# Patient Record
Sex: Male | Born: 1980 | Race: Black or African American | Hispanic: No | Marital: Single | Smoking: Current every day smoker
Health system: Southern US, Community
[De-identification: ages and names within clinical notes are randomized; demographics above are authoritative.]

## PROBLEM LIST (undated history)

## (undated) DIAGNOSIS — E119 Type 2 diabetes mellitus without complications: Secondary | ICD-10-CM

## (undated) DIAGNOSIS — E7800 Pure hypercholesterolemia, unspecified: Secondary | ICD-10-CM

---

## 2015-02-11 ENCOUNTER — Emergency Department (HOSPITAL_BASED_OUTPATIENT_CLINIC_OR_DEPARTMENT_OTHER)
Admission: EM | Admit: 2015-02-11 | Discharge: 2015-02-11 | Disposition: A | Discharge status: Home / Self Care | Payer: Self-pay | Attending: Emergency Medicine | Admitting: Emergency Medicine

## 2015-02-11 ENCOUNTER — Emergency Department (HOSPITAL_BASED_OUTPATIENT_CLINIC_OR_DEPARTMENT_OTHER): Payer: Self-pay

## 2015-02-11 ENCOUNTER — Encounter (HOSPITAL_BASED_OUTPATIENT_CLINIC_OR_DEPARTMENT_OTHER): Payer: Self-pay | Admitting: Emergency Medicine

## 2015-02-11 DIAGNOSIS — Z79899 Other long term (current) drug therapy: Secondary | ICD-10-CM | POA: Insufficient documentation

## 2015-02-11 DIAGNOSIS — Z794 Long term (current) use of insulin: Secondary | ICD-10-CM | POA: Insufficient documentation

## 2015-02-11 DIAGNOSIS — R079 Chest pain, unspecified: Secondary | ICD-10-CM | POA: Insufficient documentation

## 2015-02-11 DIAGNOSIS — Z720 Tobacco use: Secondary | ICD-10-CM | POA: Insufficient documentation

## 2015-02-11 DIAGNOSIS — E780 Pure hypercholesterolemia: Secondary | ICD-10-CM | POA: Insufficient documentation

## 2015-02-11 DIAGNOSIS — E119 Type 2 diabetes mellitus without complications: Secondary | ICD-10-CM | POA: Insufficient documentation

## 2015-02-11 HISTORY — DX: Type 2 diabetes mellitus without complications: E11.9

## 2015-02-11 HISTORY — DX: Pure hypercholesterolemia, unspecified: E78.00

## 2015-02-11 LAB — CBC WITH DIFFERENTIAL/PLATELET
Basophils Absolute: 0.1 10*3/uL (ref 0.0–0.1)
Basophils Relative: 1 % (ref 0–1)
Eosinophils Absolute: 0.6 10*3/uL (ref 0.0–0.7)
Eosinophils Relative: 6 % — ABNORMAL HIGH (ref 0–5)
HEMATOCRIT: 43 % (ref 39.0–52.0)
Hemoglobin: 14.5 g/dL (ref 13.0–17.0)
LYMPHS ABS: 3 10*3/uL (ref 0.7–4.0)
LYMPHS PCT: 35 % (ref 12–46)
MCH: 28.9 pg (ref 26.0–34.0)
MCHC: 33.7 g/dL (ref 30.0–36.0)
MCV: 85.8 fL (ref 78.0–100.0)
MONOS PCT: 8 % (ref 3–12)
Monocytes Absolute: 0.7 10*3/uL (ref 0.1–1.0)
NEUTROS ABS: 4.3 10*3/uL (ref 1.7–7.7)
NEUTROS PCT: 50 % (ref 43–77)
PLATELETS: 236 10*3/uL (ref 150–400)
RBC: 5.01 MIL/uL (ref 4.22–5.81)
RDW: 12.7 % (ref 11.5–15.5)
WBC: 8.6 10*3/uL (ref 4.0–10.5)

## 2015-02-11 LAB — BASIC METABOLIC PANEL
Anion gap: 8 (ref 5–15)
BUN: 9 mg/dL (ref 6–20)
CALCIUM: 8.7 mg/dL — AB (ref 8.9–10.3)
CO2: 25 mmol/L (ref 22–32)
Chloride: 105 mmol/L (ref 101–111)
Creatinine, Ser: 0.78 mg/dL (ref 0.61–1.24)
GFR calc Af Amer: >60 mL/min (ref >60)
GFR calc non Af Amer: >60 mL/min (ref >60)
Glucose, Bld: 188 mg/dL — ABNORMAL HIGH (ref 65–99)
POTASSIUM: 4 mmol/L (ref 3.5–5.1)
Sodium: 138 mmol/L (ref 135–145)

## 2015-02-11 LAB — TROPONIN I: Troponin I: <0.03 ng/mL (ref <0.031)

## 2015-02-11 MED ORDER — GI COCKTAIL ~~LOC~~
30.0000 mL | Freq: Once | ORAL | Status: AC
  Administered 2015-02-11: 30 mL via ORAL
  Filled 2015-02-11: qty 30

## 2015-02-11 NOTE — Discharge Instructions (Signed)

## 2015-02-11 NOTE — ED Notes (Signed)
Patient reports that he was at home and started to have pain to his left chest yesterday  Reports that he was sweating last night. Today the pain increased in intensity and the diaphoresis was worse.

## 2015-02-11 NOTE — ED Provider Notes (Signed)
CSN: [id number]     Arrival date & time 02/11/15  1934 History  This chart was scribed for Benjiman Core, MD by Phillis Haggis, ED Scribe. This patient was seen in room MH06/MH06 and patient care was started at 7:48 PM.   Chief Complaint  Patient presents with  . Chest Pain   The history is provided by the patient. No language interpreter was used.  HPI Comments: Matthew Faulkner is a 34 y.o. Male with hx of DM and hypercholesteremia who presents to the Emergency Department complaining of left tight, gradually worsening left chest pain and diaphoresis onset one night ago. He states that he was at work today around 4 PM with no changes in physical activity when he began to increasingly sweat more over a period of 45 minutes, soaking a t-shirt. States that he works at a [employer]. Reports associated nausea. States that he was seen for similar pain 10 years ago due to stress and was told that he had an enlarged heart.  He denies fever, chills, vomiting, cough, or SOB. Denies hx of MI or family hx of early onset MI. Pt states that he smokes 1 ppd; denies using cocaine, street drugs, or drinking alcohol.  Past Medical History  Diagnosis Date  . Diabetes mellitus without complication   . Hypercholesteremia    History reviewed. No pertinent past surgical history. History reviewed. No pertinent family history. History  Substance Use Topics  . Smoking status: Current Every Day Smoker  . Smokeless tobacco: Not on file  . Alcohol Use: No    Review of Systems  Constitutional: Positive for diaphoresis.  Cardiovascular: Positive for chest pain.  All other systems reviewed and are negative.  Allergies  Review of patient's allergies indicates no known allergies.  Home Medications   Prior to Admission medications   Medication Sig Start Date End Date Taking? Authorizing Provider  insulin aspart protamine- aspart (NOVOLOG MIX 70/30) (70-30) 100 UNIT/ML injection Inject 20 Units into the skin  2 (two) times daily with a meal.   Yes Historical Provider, MD  simvastatin (ZOCOR) 20 MG tablet Take 20 mg by mouth daily.   Yes Historical Provider, MD   BP 130/81 mmHg  Pulse 98  Temp(Src) 98.2 F (36.8 C) (Oral)  Resp 18  Ht  (1.803 m)  Wt 284 lb (128.822 kg)  BMI 39.63 kg/m2  SpO2 100%  Physical Exam  Constitutional: He is oriented to person, place, and time. He appears well-developed and well-nourished.  HENT:  Head: Normocephalic and atraumatic.  Eyes: EOM are normal.  Neck: Normal range of motion. Neck supple.  Cardiovascular: Normal rate and regular rhythm.   Pulmonary/Chest: Effort normal and breath sounds normal.  Slight sternal tenderness  Musculoskeletal: Normal range of motion.  Neurological: He is alert and oriented to person, place, and time.  Skin: Skin is warm and dry.  Psychiatric: He has a normal mood and affect. His behavior is normal.  Nursing note and vitals reviewed.   ED Course  Procedures (including critical care time) DIAGNOSTIC STUDIES: Oxygen Saturation is 100% on RA, normal by my interpretation.    COORDINATION OF CARE: 7:52 PM-Discussed treatment plan which includes x-ray, labs, and EKG with pt at bedside and pt agreed to plan.   Labs Review Labs Reviewed  CBC WITH DIFFERENTIAL/PLATELET - Abnormal; Notable for the following:    Eosinophils Relative 6 (*)    All other components within normal limits  BASIC METABOLIC PANEL - Abnormal; Notable for the following:  Glucose, Bld 188 (*)    Calcium 8.7 (*)    All other components within normal limits  TROPONIN I    Imaging Review Dg Chest 2 View  02/11/2015   CLINICAL DATA:  34 year old male with chest pain  EXAM: CHEST  2 VIEW  COMPARISON:  None.  FINDINGS: The heart size and mediastinal contours are within normal limits. Both lungs are clear. The visualized skeletal structures are unremarkable.  IMPRESSION: No active cardiopulmonary disease.   Electronically Signed   By: Elgie CollardArash   Radparvar M.D.   On: 02/11/2015 20:15     EKG Interpretation   Date/Time:  Tuesday February 11 2015 19:43:40 EDT Ventricular Rate:  97 PR Interval:  150 QRS Duration: 82 QT Interval:  352 QTC Calculation: 447 R Axis:   65 Text Interpretation:  Normal sinus rhythm Normal ECG Confirmed by  Rubin PayorPICKERING  MD, Harrold DonathNATHAN [id number]) on 02/11/2015 7:46:30 PM      MDM   Final diagnoses:  Chest pain, unspecified chest pain type    Chest pain. EKG and lab work reassuring. X-ray reassuring. Somewhat worried some story but has apparently had this before. Will discharge home but will need to follow-up with primary care doctor. I personally performed the services described in this documentation, which was scribed in my presence. The recorded information has been reviewed and is accurate.     Benjiman CoreNathan Christon Gallaway, MD 02/12/15 2125

## 2016-10-16 IMAGING — CR DG CHEST 2V
2 series · 2 of 2 positions shown · non-contrast
Comparison: None.

CLINICAL DATA: 33-year-old male with chest pain

EXAM:
CHEST  2 VIEW

[w chest pa]
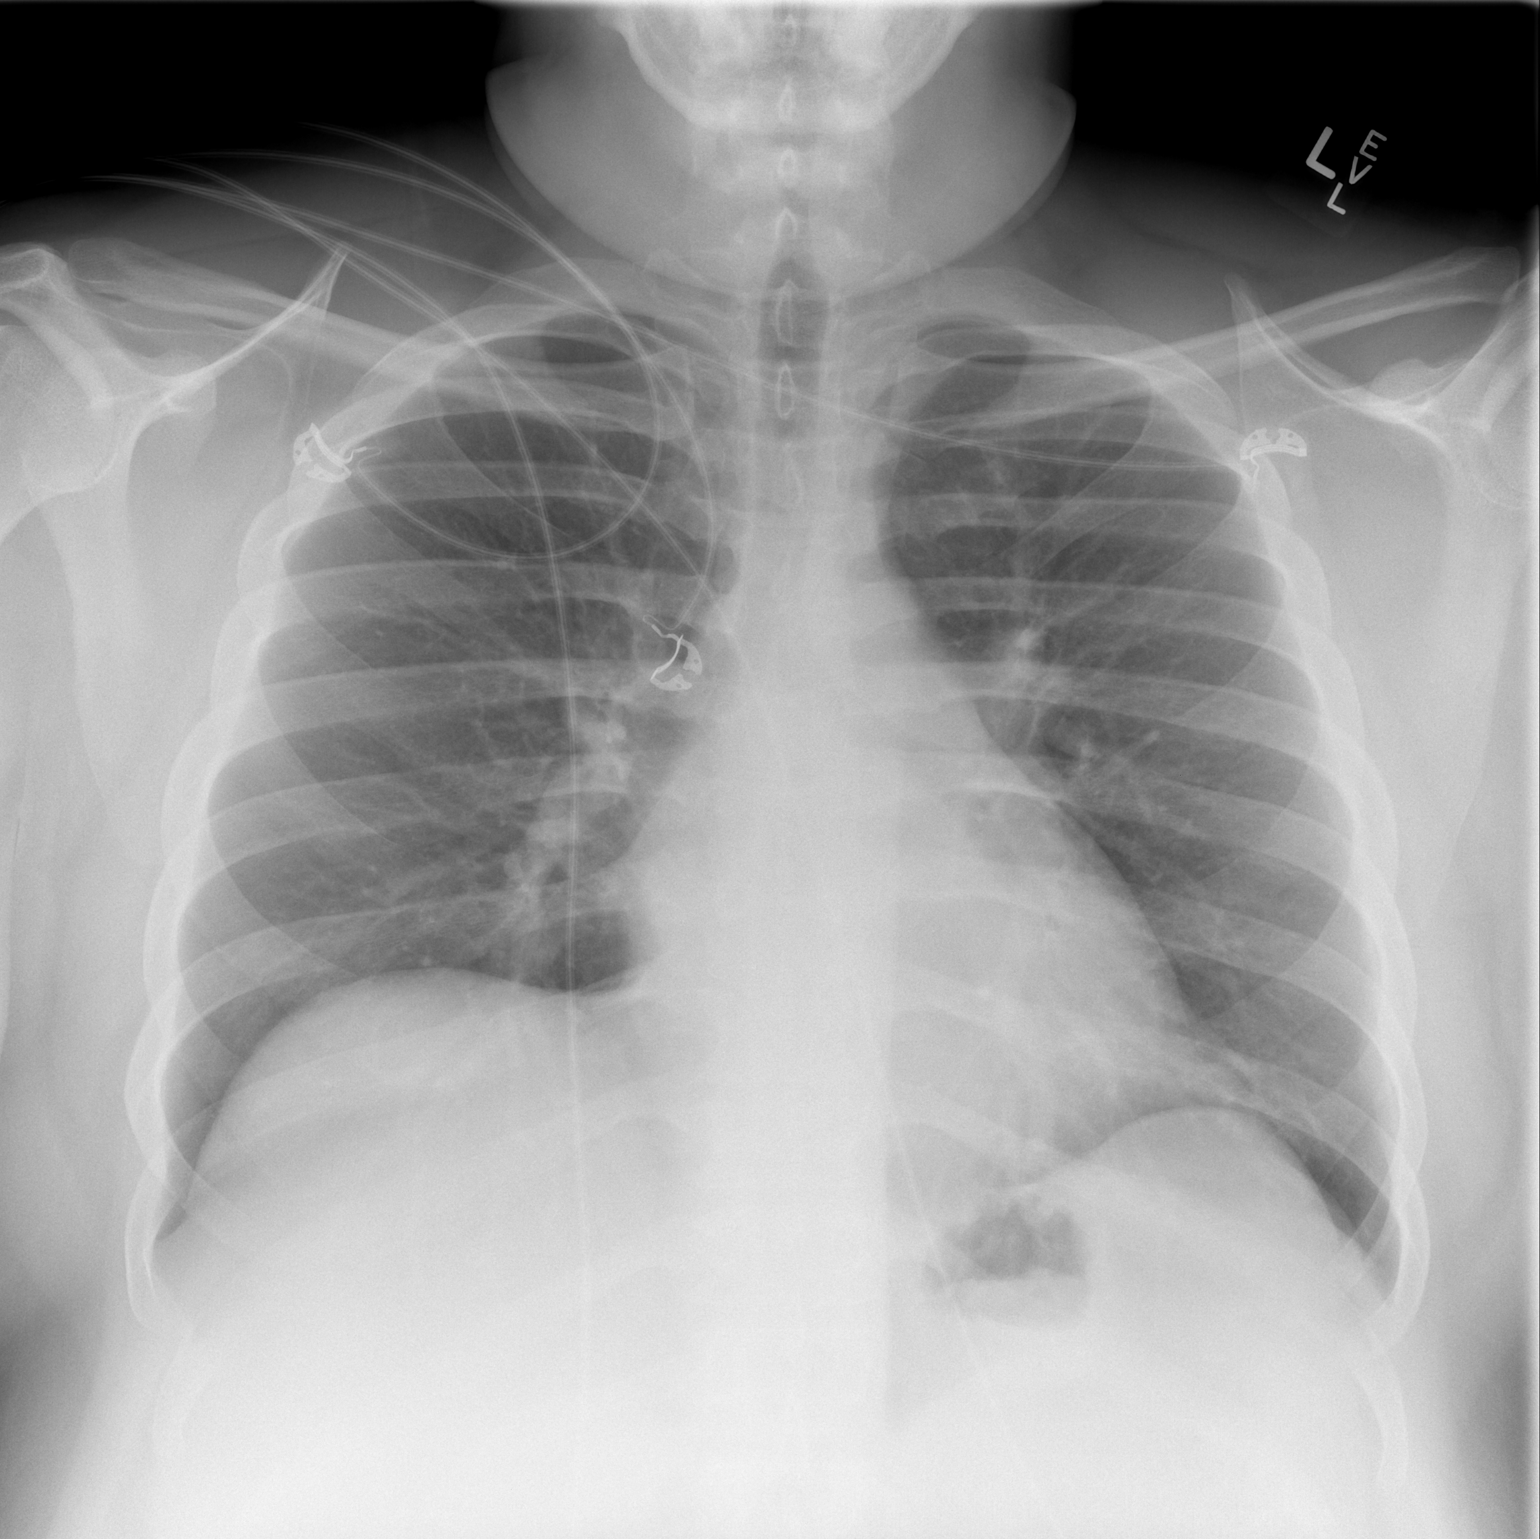

[w chest lat]
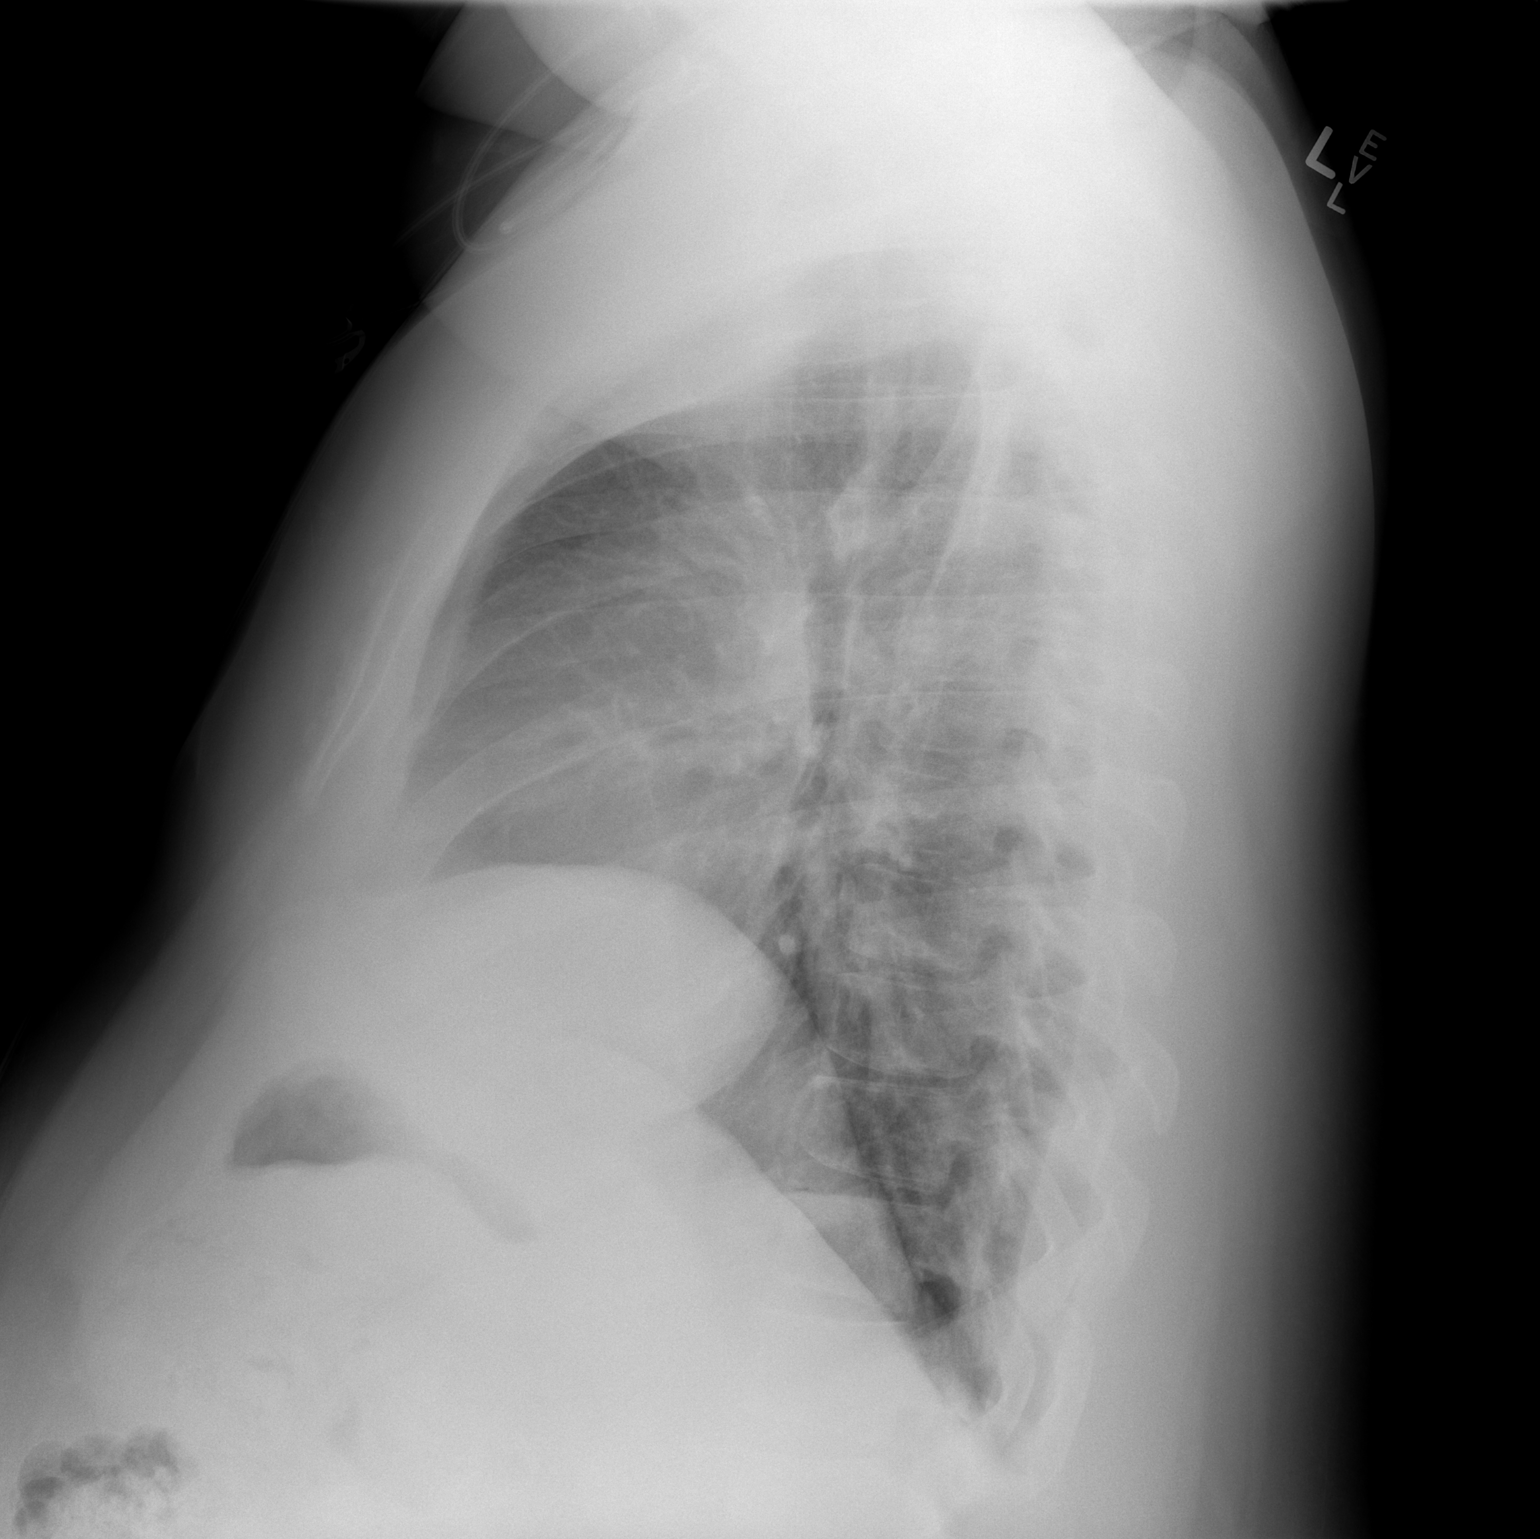

[2 of 2 positions shown; findings below may reference images not displayed]

FINDINGS: The heart size and mediastinal contours are within normal limits.
Both lungs are clear. The visualized skeletal structures are
unremarkable.
IMPRESSION: No active cardiopulmonary disease.
